# Patient Record
Sex: Male | Born: 1995 | Race: Black or African American | Hispanic: No | Marital: Married | Smoking: Never smoker
Health system: Southern US, Community
[De-identification: ages and names within clinical notes are randomized; demographics above are authoritative.]

---
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
## 2019-05-03 ENCOUNTER — Encounter (HOSPITAL_COMMUNITY): Payer: Self-pay

## 2019-05-03 ENCOUNTER — Ambulatory Visit (HOSPITAL_COMMUNITY)
Admission: EM | Admit: 2019-05-03 | Discharge: 2019-05-03 | Disposition: A | Discharge status: Home / Self Care | Payer: 59 | Attending: Urgent Care | Admitting: Urgent Care

## 2019-05-03 DIAGNOSIS — R1031 Right lower quadrant pain: Secondary | ICD-10-CM | POA: Insufficient documentation

## 2019-05-03 MED ORDER — NAPROXEN 500 MG PO TABS: 500.0000 mg | ORAL_TABLET | Freq: Two times a day (BID) | ORAL | 0 refills | Status: DC

## 2019-05-03 NOTE — ED Provider Notes (Signed)
Danny Dunlap   MRN: [id number] DOB: 1996/05/11  Subjective:   Danny Dunlap is a 23 y.o. male presenting for 1 week history of recurrent right-sided groin/testicular pain.  Patient states that symptoms started when he was walking upstairs.  He does have to do heavy lifting and strenuous work for his job.  He is concerned that his pain may worsen.  Of note patient did notice that he had this problem a couple of years ago and was told that he pulled a muscle.  Patient is sexually active but feels that he is very careful avoiding STIs.  His low suspicion for this.  Denies dysuria, hematuria, urinary frequency, penile discharge, penile swelling, anal pain, groin pain.   He is not currently taking any medications and has no known food or drug allergies.  Denies past medical and surgical history.   Social History   Tobacco Use  . Smoking status: Never Smoker  . Smokeless tobacco: Never Used  Substance Use Topics  . Alcohol use: Not on file  . Drug use: Not on file    ROS   Objective:   Vitals: BP 121/76 (BP Location: Right Arm)   Pulse 80   Temp 98.3 F (36.8 C) (Oral)   Resp 16   SpO2 99%   Physical Exam Constitutional:      General: He is not in acute distress.    Appearance: Normal appearance. He is well-developed and normal weight. He is not ill-appearing, toxic-appearing or diaphoretic.  HENT:     Head: Normocephalic and atraumatic.     Right Ear: External ear normal.     Left Ear: External ear normal.     Nose: Nose normal.     Mouth/Throat:     Pharynx: Oropharynx is clear.  Eyes:     General: No scleral icterus.       Right eye: No discharge.        Left eye: No discharge.     Extraocular Movements: Extraocular movements intact.     Pupils: Pupils are equal, round, and reactive to light.  Neck:     Musculoskeletal: Normal range of motion.  Cardiovascular:     Rate and Rhythm: Normal rate and regular rhythm.     Heart sounds: No murmur. No  friction rub. No gallop.   Pulmonary:     Effort: Pulmonary effort is normal. No respiratory distress.     Breath sounds: No wheezing or rales.  Abdominal:     General: Bowel sounds are normal. There is no distension.     Palpations: Abdomen is soft. There is no mass.     Tenderness: There is no abdominal tenderness. There is no guarding or rebound.  Genitourinary:    Penis: Normal. No phimosis, paraphimosis, hypospadias, erythema, tenderness, discharge, swelling or lesions.      Epididymis:     Right: Not inflamed or enlarged. No mass or tenderness.     Left: Not inflamed or enlarged. No mass or tenderness.    Skin:    General: Skin is warm and dry.  Neurological:     Mental Status: He is alert and oriented to person, place, and time.  Psychiatric:        Mood and Affect: Mood normal.        Behavior: Behavior normal.        Thought Content: Thought content normal.        Judgment: Judgment normal.      Assessment and Plan :  1. Groin pain, right     Suspect possible hernia versus groin strain.  Will manage conservatively with Naprosyn and recommended patient request consult with [facility]. Counseled patient on potential for adverse effects with medications prescribed/recommended today, ER and return-to-clinic precautions discussed, patient verbalized understanding.    Wallis Bamberg, [place] 05/06/19 [phone]

## 2019-05-03 NOTE — ED Triage Notes (Signed)
Pt present groin pain in his testicle area. Pt states yesterday he was trying to get up some stairs and stretch his right leg and that's when his groin/testicle begin to hurt. The pain slow pt down to walk that how much it hurt.

## 2019-05-05 LAB — CYTOLOGY, (ORAL, ANAL, URETHRAL) ANCILLARY ONLY
Chlamydia: NEGATIVE
Neisseria Gonorrhea: NEGATIVE
Trichomonas: NEGATIVE

## 2020-11-06 ENCOUNTER — Ambulatory Visit
Admission: EM | Admit: 2020-11-06 | Discharge: 2020-11-06 | Disposition: A | Discharge status: Home / Self Care | Payer: 59 | Attending: Emergency Medicine | Admitting: Emergency Medicine

## 2020-11-06 ENCOUNTER — Other Ambulatory Visit: Payer: Self-pay

## 2020-11-06 ENCOUNTER — Encounter: Payer: Self-pay | Admitting: Emergency Medicine

## 2020-11-06 DIAGNOSIS — U071 COVID-19: Secondary | ICD-10-CM

## 2020-11-06 MED ORDER — IBUPROFEN 600 MG PO TABS: 600.0000 mg | ORAL_TABLET | Freq: Four times a day (QID) | ORAL | 0 refills | Status: AC | PRN

## 2020-11-06 MED ORDER — FLUTICASONE PROPIONATE 50 MCG/ACT NA SUSP: 2.0000 | Freq: Every day | NASAL | 0 refills | Status: AC

## 2020-11-06 MED ORDER — BENZONATATE 200 MG PO CAPS: 200.0000 mg | ORAL_CAPSULE | Freq: Three times a day (TID) | ORAL | 0 refills | Status: AC | PRN

## 2020-11-06 NOTE — ED Provider Notes (Signed)
HPI  SUBJECTIVE:  Danny Dunlap is a 25 y.o. male who presents with body aches, headaches, nasal congestion, clear rhinorrhea, sore throat, postnasal drip, cough, sinus pressure starting 8 days ago.  He states that he and his wife both had a positive home COVID test yesterday.  No fevers, loss of sense of taste or smell, shortness of breath, nausea, vomiting, diarrhea, abdominal pain, wheezing.  He reports chest soreness secondary to the cough and states that his chest feels tight.  No sinus pain, ear pain, upper dental pain, facial swelling.  He is able to sleep at night with NyQuil.  He got the first dose of [employer] vaccine.  He was exposed to COVID 9 days ago.  No antipyretic in the past 6 hours.  No antibiotics in the past month.  He has tried DayQuil, NyQuil, Mucinex and, ibuprofen 400 mg every 6 to 8 hours with improvement in his symptoms.  Symptoms are worse with lying down.  He has no past medical history.  PMD: None    History reviewed. No pertinent past medical history.  History reviewed. No pertinent surgical history.  History reviewed. No pertinent family history.  Social History   Tobacco Use   Smoking status: Never   Smokeless tobacco: Never    No current facility-administered medications for this encounter.  Current Outpatient Medications:    benzonatate (TESSALON) 200 MG capsule, Take 1 capsule (200 mg total) by mouth 3 (three) times daily as needed for cough., Disp: 30 capsule, Rfl: 0   fluticasone (FLONASE) 50 MCG/ACT nasal spray, Place 2 sprays into both nostrils daily., Disp: 16 g, Rfl: 0   ibuprofen (ADVIL) 600 MG tablet, Take 1 tablet (600 mg total) by mouth every 6 (six) hours as needed., Disp: 30 tablet, Rfl: 0  No Known Allergies   ROS  As noted in HPI.   Physical Exam  BP 121/79 (BP Location: Right Arm)   Pulse 83   Temp 98.3 F (36.8 C) (Temporal)   Resp 16   SpO2 97%   Constitutional: Well developed, well nourished, no acute  distress Eyes:  EOMI, conjunctiva normal bilaterally HENT: Normocephalic, atraumatic,mucus membranes moist.  Erythematous, swollen turbinates.  Clear nasal congestion.  No maxillary, frontal sinus tenderness.  Normal oropharynx.  Positive postnasal drip. Neck: No cervical lymphadenopathy Respiratory: Normal inspiratory effort, lungs clear bilaterally.  Positive anterior chest wall tenderness Cardiovascular: Normal rate, regular rhythm, no murmurs rubs or gallop GI: nondistended skin: No rash, skin intact Musculoskeletal: no deformities Neurologic: Alert & oriented x 3, no focal neuro deficits Psychiatric: Speech and behavior appropriate   ED Course   Medications - No data to display  No orders of the defined types were placed in this encounter.   No results found for this or any previous visit (from the past 24 hour(s)). No results found.  ED Clinical Impression  1. COVID-19 virus infection      ED Assessment/Plan  Patient with a COVID infection.  He is out of the window for antiviral treatment.  There is no evidence of a secondary sinus infection.  Suspect that he has some bronchospasm/pulmonary inflammation causing the chest tightness and contributing to the cough.  Home with Tessalon, albuterol inhaler with a spacer, ibuprofen/Tylenol, Flonase, saline nasal irrigation.  Continue Mucinex, discontinue other cold medications. Will provide primary care list for ongoing care and order assistance in finding a PMD.  Discussed MDM, treatment plan, and plan for follow-up with patient.  patient agrees with plan.  Meds ordered this encounter  Medications   fluticasone (FLONASE) 50 MCG/ACT nasal spray    Sig: Place 2 sprays into both nostrils daily.    Dispense:  16 g    Refill:  0   benzonatate (TESSALON) 200 MG capsule    Sig: Take 1 capsule (200 mg total) by mouth 3 (three) times daily as needed for cough.    Dispense:  30 capsule    Refill:  0   ibuprofen (ADVIL) 600 MG  tablet    Sig: Take 1 tablet (600 mg total) by mouth every 6 (six) hours as needed.    Dispense:  30 tablet    Refill:  0      *This clinic note was created using Scientist, clinical (histocompatibility and immunogenetics). Therefore, there may be occasional mistakes despite careful proofreading.  ?    Domenick Gong, MD 11/07/20 1127

## 2020-11-06 NOTE — Discharge Instructions (Addendum)
Tessalon, 2 puffs from your albuterol inhaler using your spacer every 4-6 hours as needed for coughing, chest tightness, 600 mg of ibuprofen combined with 1000 mg of Tylenol 3-4 times a day, Flonase.  Continue Mucinex, discontinue other cold medications.  Saline nasal irrigation with a Lloyd Huger Med rinse and distilled water as often as you want   Below is a list of primary care practices who are taking new patients for you to follow-up with.  [facility] internal medicine clinic Ground Floor - [facility], [address] [place], [place], [place] [address] [phone]  [facility] [address] [place], [place] [address] [phone]  [facility] [address] [place], [place] [address] [phone]  [facility] [phone] [address] [place] [address]  [facility]: [address] [place] [address]  [phone]  [facility]: [address] [place] [address]  [phone]  [facility] : [address] [place] [address] [phone]  [facility]: [address] [place] [address] [phone]  [facility]: [address] [place] [address] [phone]  Dr. Oneal Grout [address] [facility] [place] [address]  [phone]  Go to www.goodrx.com  or www.costplusdrugs.com to look up your medications. This will give you a list of where you can find your prescriptions at the most affordable prices. Or ask the pharmacist what the cash price is, or if they have any other discount programs available to help make your medication more affordable. This can be less expensive than what you would pay with insurance.

## 2020-11-06 NOTE — ED Triage Notes (Signed)
Tested positive for covid yesterday with a at [facility] covid test.  Cough, headache, eye pain, lower back pain symptoms started on June 10th.
